# Patient Record
Sex: Male | Born: 1980 | Hispanic: Yes | Marital: Single | Smoking: Former smoker
Health system: Southern US, Community
[De-identification: ages and names within clinical notes are randomized; demographics above are authoritative.]

## PROBLEM LIST (undated history)

## (undated) DIAGNOSIS — D649 Anemia, unspecified: Secondary | ICD-10-CM

## (undated) DIAGNOSIS — M109 Gout, unspecified: Secondary | ICD-10-CM

## (undated) DIAGNOSIS — N189 Chronic kidney disease, unspecified: Secondary | ICD-10-CM

## (undated) HISTORY — DX: Chronic kidney disease, unspecified: N18.9

## (undated) HISTORY — PX: NO PAST SURGERIES: SHX2092

---

## 2015-07-11 ENCOUNTER — Encounter: Payer: Self-pay | Admitting: Vascular Surgery

## 2015-07-11 ENCOUNTER — Other Ambulatory Visit: Payer: Self-pay | Admitting: *Deleted

## 2015-07-11 DIAGNOSIS — N179 Acute kidney failure, unspecified: Secondary | ICD-10-CM

## 2015-07-11 DIAGNOSIS — Z01810 Encounter for preprocedural cardiovascular examination: Secondary | ICD-10-CM

## 2015-07-14 ENCOUNTER — Other Ambulatory Visit: Payer: Self-pay

## 2015-07-14 ENCOUNTER — Encounter (HOSPITAL_COMMUNITY): Payer: Self-pay | Admitting: *Deleted

## 2015-07-14 ENCOUNTER — Other Ambulatory Visit: Payer: Self-pay | Admitting: Nephrology

## 2015-07-14 DIAGNOSIS — N179 Acute kidney failure, unspecified: Secondary | ICD-10-CM

## 2015-07-14 DIAGNOSIS — R809 Proteinuria, unspecified: Secondary | ICD-10-CM

## 2015-07-14 MED ORDER — DEXTROSE 5 % IV SOLN
1.5000 g | INTRAVENOUS | Status: AC
  Administered 2015-07-15: 1.5 g via INTRAVENOUS
  Filled 2015-07-14: qty 1.5

## 2015-07-14 MED ORDER — SODIUM CHLORIDE 0.9 % IV SOLN
INTRAVENOUS | Status: DC
  Administered 2015-07-15: 09:00:00 via INTRAVENOUS

## 2015-07-14 MED ORDER — CHLORHEXIDINE GLUCONATE CLOTH 2 % EX PADS: 6.0000 | MEDICATED_PAD | Freq: Once | CUTANEOUS | Status: DC

## 2015-07-14 NOTE — Progress Notes (Signed)
I was able to get some info from wife but not all.Will need to be completed on arrival.Spanish interpretor set up by Dondra SpryGail

## 2015-07-15 ENCOUNTER — Ambulatory Visit (HOSPITAL_COMMUNITY): Payer: Self-pay

## 2015-07-15 ENCOUNTER — Ambulatory Visit (HOSPITAL_COMMUNITY)
Admission: RE | Admit: 2015-07-15 | Discharge: 2015-07-15 | Disposition: A | Discharge status: Home / Self Care | Payer: Self-pay | Source: Ambulatory Visit | Attending: Vascular Surgery | Admitting: Vascular Surgery

## 2015-07-15 ENCOUNTER — Ambulatory Visit (HOSPITAL_COMMUNITY): Payer: Self-pay | Admitting: Certified Registered Nurse Anesthetist

## 2015-07-15 ENCOUNTER — Other Ambulatory Visit: Payer: Self-pay | Admitting: *Deleted

## 2015-07-15 ENCOUNTER — Telehealth: Payer: Self-pay | Admitting: Vascular Surgery

## 2015-07-15 ENCOUNTER — Encounter (HOSPITAL_COMMUNITY): Payer: Self-pay | Admitting: *Deleted

## 2015-07-15 ENCOUNTER — Encounter (HOSPITAL_COMMUNITY)
Admission: RE | Disposition: A | Discharge status: Home / Self Care | Payer: Self-pay | Source: Ambulatory Visit | Attending: Vascular Surgery

## 2015-07-15 DIAGNOSIS — N186 End stage renal disease: Secondary | ICD-10-CM | POA: Insufficient documentation

## 2015-07-15 DIAGNOSIS — Z95828 Presence of other vascular implants and grafts: Secondary | ICD-10-CM

## 2015-07-15 DIAGNOSIS — Z992 Dependence on renal dialysis: Secondary | ICD-10-CM | POA: Insufficient documentation

## 2015-07-15 DIAGNOSIS — M109 Gout, unspecified: Secondary | ICD-10-CM | POA: Insufficient documentation

## 2015-07-15 DIAGNOSIS — Z419 Encounter for procedure for purposes other than remedying health state, unspecified: Secondary | ICD-10-CM

## 2015-07-15 DIAGNOSIS — Z01810 Encounter for preprocedural cardiovascular examination: Secondary | ICD-10-CM

## 2015-07-15 DIAGNOSIS — N185 Chronic kidney disease, stage 5: Secondary | ICD-10-CM

## 2015-07-15 HISTORY — DX: Anemia, unspecified: D64.9

## 2015-07-15 HISTORY — PX: INSERTION OF DIALYSIS CATHETER: SHX1324

## 2015-07-15 HISTORY — DX: Gout, unspecified: M10.9

## 2015-07-15 LAB — POCT I-STAT 4, (NA,K, GLUC, HGB,HCT)
Glucose, Bld: 97 mg/dL (ref 65–99)
HCT: 29 % — ABNORMAL LOW (ref 39.0–52.0)
Hemoglobin: 9.9 g/dL — ABNORMAL LOW (ref 13.0–17.0)
Potassium: 4.3 mmol/L (ref 3.5–5.1)
Sodium: 135 mmol/L (ref 135–145)

## 2015-07-15 SURGERY — INSERTION OF DIALYSIS CATHETER
Anesthesia: Monitor Anesthesia Care | Site: Neck | Laterality: Right

## 2015-07-15 MED ORDER — FENTANYL CITRATE (PF) 100 MCG/2ML IJ SOLN
25.0000 ug | INTRAMUSCULAR | Status: DC | PRN
  Administered 2015-07-15: 50 ug via INTRAVENOUS

## 2015-07-15 MED ORDER — SODIUM CHLORIDE 0.9 % IV SOLN
INTRAVENOUS | Status: DC | PRN
  Administered 2015-07-15: 10:00:00

## 2015-07-15 MED ORDER — LIDOCAINE HCL (PF) 1 % IJ SOLN
INTRAMUSCULAR | Status: AC
  Filled 2015-07-15: qty 30

## 2015-07-15 MED ORDER — LIDOCAINE-EPINEPHRINE (PF) 1 %-1:200000 IJ SOLN
INTRAMUSCULAR | Status: AC
  Filled 2015-07-15: qty 30

## 2015-07-15 MED ORDER — FENTANYL CITRATE (PF) 250 MCG/5ML IJ SOLN
INTRAMUSCULAR | Status: AC
  Filled 2015-07-15: qty 5

## 2015-07-15 MED ORDER — FENTANYL CITRATE (PF) 100 MCG/2ML IJ SOLN
INTRAMUSCULAR | Status: AC
  Filled 2015-07-15: qty 2

## 2015-07-15 MED ORDER — PHENYLEPHRINE 40 MCG/ML (10ML) SYRINGE FOR IV PUSH (FOR BLOOD PRESSURE SUPPORT)
PREFILLED_SYRINGE | INTRAVENOUS | Status: AC
  Filled 2015-07-15: qty 10

## 2015-07-15 MED ORDER — LIDOCAINE HCL (CARDIAC) 20 MG/ML IV SOLN
INTRAVENOUS | Status: AC
  Filled 2015-07-15: qty 5

## 2015-07-15 MED ORDER — MIDAZOLAM HCL 2 MG/2ML IJ SOLN
INTRAMUSCULAR | Status: AC
  Filled 2015-07-15: qty 2

## 2015-07-15 MED ORDER — LIDOCAINE HCL (PF) 1 % IJ SOLN
INTRAMUSCULAR | Status: DC | PRN
  Administered 2015-07-15: 14 mL

## 2015-07-15 MED ORDER — PROPOFOL 500 MG/50ML IV EMUL
INTRAVENOUS | Status: DC | PRN
  Administered 2015-07-15: 50 ug/kg/min via INTRAVENOUS

## 2015-07-15 MED ORDER — HEPARIN SODIUM (PORCINE) 1000 UNIT/ML IJ SOLN
INTRAMUSCULAR | Status: DC | PRN
  Administered 2015-07-15: 3 mL via INTRAVENOUS

## 2015-07-15 MED ORDER — ONDANSETRON HCL 4 MG/2ML IJ SOLN
INTRAMUSCULAR | Status: AC
  Filled 2015-07-15: qty 2

## 2015-07-15 MED ORDER — PROPOFOL 10 MG/ML IV BOLUS
INTRAVENOUS | Status: DC | PRN
Start: 2015-07-15 — End: 2015-07-15
  Administered 2015-07-15 (×3): 20 mg via INTRAVENOUS
  Administered 2015-07-15: 80 mg via INTRAVENOUS

## 2015-07-15 MED ORDER — HEPARIN SODIUM (PORCINE) 1000 UNIT/ML IJ SOLN
INTRAMUSCULAR | Status: AC
  Filled 2015-07-15: qty 1

## 2015-07-15 MED ORDER — ONDANSETRON HCL 4 MG/2ML IJ SOLN
INTRAMUSCULAR | Status: DC | PRN
  Administered 2015-07-15: 4 mg via INTRAVENOUS

## 2015-07-15 MED ORDER — 0.9 % SODIUM CHLORIDE (POUR BTL) OPTIME
TOPICAL | Status: DC | PRN
  Administered 2015-07-15: 1000 mL

## 2015-07-15 MED ORDER — MIDAZOLAM HCL 5 MG/5ML IJ SOLN
INTRAMUSCULAR | Status: DC | PRN
  Administered 2015-07-15: 2 mg via INTRAVENOUS

## 2015-07-15 MED ORDER — FENTANYL CITRATE (PF) 100 MCG/2ML IJ SOLN
INTRAMUSCULAR | Status: DC | PRN
  Administered 2015-07-15: 50 ug via INTRAVENOUS
  Administered 2015-07-15: 25 ug via INTRAVENOUS
  Administered 2015-07-15: 50 ug via INTRAVENOUS
  Administered 2015-07-15: 25 ug via INTRAVENOUS

## 2015-07-15 MED ORDER — PHENYLEPHRINE HCL 10 MG/ML IJ SOLN
INTRAMUSCULAR | Status: DC | PRN
  Administered 2015-07-15 (×2): 80 ug via INTRAVENOUS

## 2015-07-15 MED ORDER — PROMETHAZINE HCL 25 MG/ML IJ SOLN: 6.2500 mg | INTRAMUSCULAR | Status: DC | PRN

## 2015-07-15 SURGICAL SUPPLY — 42 items
BAG DECANTER FOR FLEXI CONT (MISCELLANEOUS) ×3 IMPLANT
BIOPATCH RED 1 DISK 7.0 (GAUZE/BANDAGES/DRESSINGS) ×2 IMPLANT
BIOPATCH RED 1IN DISK 7.0MM (GAUZE/BANDAGES/DRESSINGS) ×1
CATH CANNON HEMO 15F 50CM (CATHETERS) IMPLANT
CATH CANNON HEMO 15FR 19 (HEMODIALYSIS SUPPLIES) IMPLANT
CATH CANNON HEMO 15FR 23CM (HEMODIALYSIS SUPPLIES) IMPLANT
CATH CANNON HEMO 15FR 31CM (HEMODIALYSIS SUPPLIES) IMPLANT
CATH CANNON HEMO 15FR 32CM (HEMODIALYSIS SUPPLIES) IMPLANT
CATH PALINDROME REV 19CM (CATHETERS) ×3 IMPLANT
COVER PROBE W GEL 5X96 (DRAPES) IMPLANT
COVER SURGICAL LIGHT HANDLE (MISCELLANEOUS) ×3 IMPLANT
DECANTER SPIKE VIAL GLASS SM (MISCELLANEOUS) ×3 IMPLANT
DRAPE C-ARM 42X72 X-RAY (DRAPES) ×3 IMPLANT
DRAPE CHEST BREAST 15X10 FENES (DRAPES) ×3 IMPLANT
GAUZE SPONGE 2X2 8PLY STRL LF (GAUZE/BANDAGES/DRESSINGS) ×1 IMPLANT
GAUZE SPONGE 4X4 16PLY XRAY LF (GAUZE/BANDAGES/DRESSINGS) ×3 IMPLANT
GLOVE BIOGEL PI IND STRL 6.5 (GLOVE) ×1 IMPLANT
GLOVE BIOGEL PI INDICATOR 6.5 (GLOVE) ×2
GLOVE ECLIPSE 6.5 STRL STRAW (GLOVE) ×3 IMPLANT
GLOVE SS BIOGEL STRL SZ 7 (GLOVE) ×1 IMPLANT
GLOVE SUPERSENSE BIOGEL SZ 7 (GLOVE) ×2
GOWN STRL REUS W/ TWL LRG LVL3 (GOWN DISPOSABLE) ×2 IMPLANT
GOWN STRL REUS W/TWL LRG LVL3 (GOWN DISPOSABLE) ×4
KIT BASIN OR (CUSTOM PROCEDURE TRAY) ×3 IMPLANT
KIT ROOM TURNOVER OR (KITS) ×3 IMPLANT
LIQUID BAND (GAUZE/BANDAGES/DRESSINGS) ×3 IMPLANT
NEEDLE 18GX1X1/2 (RX/OR ONLY) (NEEDLE) ×3 IMPLANT
NEEDLE 22X1 1/2 (OR ONLY) (NEEDLE) ×3 IMPLANT
NEEDLE HYPO 25GX1X1/2 BEV (NEEDLE) ×3 IMPLANT
NS IRRIG 1000ML POUR BTL (IV SOLUTION) ×3 IMPLANT
PACK SURGICAL SETUP 50X90 (CUSTOM PROCEDURE TRAY) ×3 IMPLANT
PAD ARMBOARD 7.5X6 YLW CONV (MISCELLANEOUS) ×6 IMPLANT
SOAP 2 % CHG 4 OZ (WOUND CARE) ×3 IMPLANT
SPONGE GAUZE 2X2 STER 10/PKG (GAUZE/BANDAGES/DRESSINGS) ×2
SUT ETHILON 3 0 PS 1 (SUTURE) ×3 IMPLANT
SUT VICRYL 4-0 PS2 18IN ABS (SUTURE) ×3 IMPLANT
SYR 20CC LL (SYRINGE) ×3 IMPLANT
SYR 5ML LL (SYRINGE) ×6 IMPLANT
SYR CONTROL 10ML LL (SYRINGE) ×3 IMPLANT
SYRINGE 10CC LL (SYRINGE) ×3 IMPLANT
TAPE CLOTH SURG 4X10 WHT LF (GAUZE/BANDAGES/DRESSINGS) ×3 IMPLANT
WATER STERILE IRR 1000ML POUR (IV SOLUTION) ×3 IMPLANT

## 2015-07-15 NOTE — Telephone Encounter (Signed)
-----   Message from Sharee PimpleMarilyn K McChesney, RN sent at 07/15/2015 10:54 AM EST ----- Patient is already scheduled for this on 07-27-15 with Dr. Imogene Burnhen.  This was per Dr. Marland McalpineWebb's referral on 07-11-15. ----- Message -----    From: Dara LordsSamantha J Rhyne, PA-C    Sent: 07/15/2015  10:33 AM      To: Vvs Charge Pool  This pt had a diatek placement today.  He needs to f/u with anyone in the next 3-4 weeks for vein mapping and eval for permanent access.  Thanks, Lelon MastSamantha

## 2015-07-15 NOTE — Anesthesia Procedure Notes (Signed)
Procedure Name: MAC Date/Time: 07/15/2015 10:11 AM Performed by: Fabian NovemberSOLHEIM, Shakeema Lippman SALOMAN Pre-anesthesia Checklist: Patient identified, Timeout performed, Emergency Drugs available, Suction available and Patient being monitored Patient Re-evaluated:Patient Re-evaluated prior to inductionOxygen Delivery Method: Simple face mask Intubation Type: IV induction Number of attempts: 1

## 2015-07-15 NOTE — Progress Notes (Addendum)
Wife reports that patient has felt warm the last few days. Patient reports that his right great toe is hurting. Right great toe noted to be swollen. Dr. Hart RochesterLawson notified with no further orders. Vanice SarahAnthony Bastias spanish interpreter used during preop interview.

## 2015-07-15 NOTE — H&P (Signed)
  Vascular Surgery H&P  Chief Complaint: End-stage renal disease-needs vascular access  HPI: Kyle Schroeder is a 34 y.o. male who presents for evaluation of need for vascular access. Patient has only known about kidney failure for 2 weeks and now needs dialysis. Does not know cause of kidney failure. He is having tunneled catheter inserted today and will be evaluated for fistula creation in the near future.   Past Medical History  Diagnosis Date  . Chronic kidney disease   . Gout     takes Allopurinol daily  . Anemia    Past Surgical History  Procedure Laterality Date  . No past surgeries     Social History   Social History  . Marital Status: Single    Spouse Name: N/A  . Number of Children: N/A  . Years of Education: N/A   Social History Main Topics  . Smoking status: Never Smoker   . Smokeless tobacco: Never Used  . Alcohol Use: No  . Drug Use: No  . Sexual Activity: Not Asked   Other Topics Concern  . None   Social History Narrative   History reviewed. No pertinent family history. No Known Allergies Prior to Admission medications   Medication Sig Start Date End Date Taking? Authorizing Provider  allopurinol (ZYLOPRIM) 100 MG tablet Take 100 mg by mouth daily.   Yes Historical Provider, MD  IRON PO Take 1 tablet by mouth daily.   Yes Historical Provider, MD  sodium bicarbonate 650 MG tablet Take 650 mg by mouth 2 (two) times daily.   Yes Historical Provider, MD     Positive ROS: Denies chest pain, dyspnea on exertion, PND, orthopnea, hemoptysis, claudication. Does have gout involving his right knee and foot but otherwise completely negative review of systems  All other systems have been reviewed and were otherwise negative with the exception of those mentioned in the HPI and as above.  Physical Exam: Filed Vitals:   07/15/15 0905  BP: 141/80  Pulse: 72  Temp: 99 F (37.2 C)  Resp: 18    General: Alert, no acute distress HEENT: Normal for  age Cardiovascular: Regular rate and rhythm. Carotid pulses 2+, no bruits audible Respiratory: Clear to auscultation. No cyanosis, no use of accessory musculature GI: No organomegaly, abdomen is soft and non-tender Skin: No lesions in the area of chief complaint Neurologic: Sensation intact distally Psychiatric: Patient is competent for consent with normal mood and affect Musculoskeletal: No obvious deformities Extremities: 3+ femoral popliteal dorsalis pedis pulse palpable bilaterally. Mild discomfort or some right foot but no abscess cellulitis or fluctuance noted.  Labs reviewed: Potassium 4.6     Assessment/Plan:  Plan insertion tunneled hemodialysis catheter via right IJ or left IJ. Discussed with patient and his family via interpreter since patient does not speak [place]. He would like to proceed   Josephina GipJames Mayzee Reichenbach, MD 07/15/2015 10:00 AM

## 2015-07-15 NOTE — Op Note (Signed)
OPERATIVE REPORT  Date of Surgery: 07/15/2015  Surgeon: Kyle GipJames Antonella Upson, MD  Assistant: Nurse  Pre-op Diagnosis: End Stage Renal Disease N18.6  Post-op Diagnosis: End Stage Renal Disease.  Procedure: Procedure(s): INSERTION OF Right Internal Jugular DIALYSIS CATHETER.-19 cm-palindrome Bilateral ultrasound localization and internal jugular veins  Anesthesia: Mac  EBL: Minimal  Complications: None  Procedure Details: The patient was taken the operating room placed in Trendelenburg position upper chest and neck were exposed. Both internal jugular veins were imaged using B mode ultrasound-sono site and both noted to be widely patent. After prepping and draped in routine sterile manner the right IJ was entered using a supraclavicular approach guidewire passed into the right atrium under fluoroscopic guidance. A 19 cm palindrome catheter was passed to a peel-away sheath positioned appropriately tunneled peripherally and secured with nylon suture. The wound was closed Vicryl subcuticular fashion sterile dressing applied and patient taken to recovery in satisfactory condition for a chest x-ray   Kyle GipJames Aneisha Skyles, MD 07/15/2015 10:38 AM

## 2015-07-15 NOTE — Progress Notes (Signed)
Discharge instructions given to wife[speaks English] with Cherie DarkAnthony Bastias-interpreter. Advised to call MD's office for pain medicine not controlled by Tylenol, same with dressing changes.

## 2015-07-15 NOTE — Anesthesia Postprocedure Evaluation (Signed)
Anesthesia Post Note  Patient: Kyle Schroeder  Procedure(s) Performed: Procedure(s) (LRB): INSERTION OF Right Internal Jugular DIALYSIS CATHETER. (Right)  Patient location during evaluation: PACU Anesthesia Type: MAC Level of consciousness: awake and alert Pain management: pain level controlled Vital Signs Assessment: post-procedure vital signs reviewed and stable Respiratory status: spontaneous breathing, nonlabored ventilation, respiratory function stable and patient connected to nasal cannula oxygen Cardiovascular status: blood pressure returned to baseline and stable Postop Assessment: no signs of nausea or vomiting Anesthetic complications: no    Last Vitals:  Filed Vitals:   07/15/15 1215 07/15/15 1245  BP:  138/87  Pulse: 87 96  Temp: 36.6 C   Resp:      Last Pain:  Filed Vitals:   07/15/15 1252  PainSc: 5                  Jeri Jeanbaptiste S

## 2015-07-15 NOTE — Anesthesia Preprocedure Evaluation (Signed)
Anesthesia Evaluation  Patient identified by MRN, date of birth, ID band Patient awake    Reviewed: Allergy & Precautions, NPO status , Patient's Chart, lab work & pertinent test results  Airway Mallampati: II  TM Distance: >3 FB Neck ROM: Full    Dental no notable dental hx.    Pulmonary neg pulmonary ROS,    Pulmonary exam normal breath sounds clear to auscultation       Cardiovascular negative cardio ROS Normal cardiovascular exam Rhythm:Regular Rate:Normal     Neuro/Psych negative neurological ROS  negative psych ROS   GI/Hepatic negative GI ROS, Neg liver ROS,   Endo/Other  negative endocrine ROS  Renal/GU ESRFRenal disease  negative genitourinary   Musculoskeletal negative musculoskeletal ROS (+)   Abdominal   Peds negative pediatric ROS (+)  Hematology negative hematology ROS (+)   Anesthesia Other Findings   Reproductive/Obstetrics negative OB ROS                             Anesthesia Physical Anesthesia Plan  ASA: III  Anesthesia Plan: MAC   Post-op Pain Management:    Induction: Intravenous  Airway Management Planned: Simple Face Mask  Additional Equipment:   Intra-op Plan:   Post-operative Plan:   Informed Consent: I have reviewed the patients History and Physical, chart, labs and discussed the procedure including the risks, benefits and alternatives for the proposed anesthesia with the patient or authorized representative who has indicated his/her understanding and acceptance.   Dental advisory given  Plan Discussed with: CRNA and Surgeon  Anesthesia Plan Comments:         Anesthesia Quick Evaluation

## 2015-07-15 NOTE — Transfer of Care (Signed)
Immediate Anesthesia Transfer of Care Note  Patient: Kyle Schroeder  Procedure(s) Performed: Procedure(s): INSERTION OF Right Internal Jugular DIALYSIS CATHETER. (Right)  Patient Location: PACU  Anesthesia Type:MAC  Level of Consciousness: awake, alert , oriented and patient cooperative  Airway & Oxygen Therapy: Patient Spontanous Breathing and Patient connected to nasal cannula oxygen  Post-op Assessment: Report given to RN, Post -op Vital signs reviewed and stable and Patient moving all extremities X 4  Post vital signs: Reviewed and stable  Last Vitals:  Filed Vitals:   07/15/15 0905  BP: 141/80  Pulse: 72  Temp: 37.2 C  Resp: 18    Complications: No apparent anesthesia complications

## 2015-07-15 NOTE — Discharge Instructions (Signed)
° ° °  07/15/2015 Rosiland OzVictor Slay [id number] 09/11/1980  Surgeon(s): Pryor OchoaJames D Lawson, MD  Procedure(s): INSERTION OF DIALYSIS CATHETER

## 2015-07-18 ENCOUNTER — Encounter (HOSPITAL_COMMUNITY): Payer: Self-pay | Admitting: Vascular Surgery

## 2015-07-21 ENCOUNTER — Encounter: Payer: Self-pay | Admitting: Vascular Surgery

## 2015-07-27 ENCOUNTER — Ambulatory Visit (INDEPENDENT_AMBULATORY_CARE_PROVIDER_SITE_OTHER): Payer: Self-pay | Admitting: Vascular Surgery

## 2015-07-27 ENCOUNTER — Ambulatory Visit (HOSPITAL_COMMUNITY)
Admission: RE | Admit: 2015-07-27 | Discharge: 2015-07-27 | Disposition: A | Discharge status: Home / Self Care | Payer: Self-pay | Source: Ambulatory Visit | Attending: Vascular Surgery | Admitting: Vascular Surgery

## 2015-07-27 ENCOUNTER — Encounter: Payer: Self-pay | Admitting: Vascular Surgery

## 2015-07-27 ENCOUNTER — Ambulatory Visit (INDEPENDENT_AMBULATORY_CARE_PROVIDER_SITE_OTHER)
Admission: RE | Admit: 2015-07-27 | Discharge: 2015-07-27 | Disposition: A | Discharge status: Home / Self Care | Payer: Self-pay | Source: Ambulatory Visit | Attending: Vascular Surgery | Admitting: Vascular Surgery

## 2015-07-27 VITALS — BP 120/76 | HR 98 | Temp 97.7°F | Resp 16 | Ht 67.5 in | Wt 140.0 lb

## 2015-07-27 DIAGNOSIS — Z992 Dependence on renal dialysis: Secondary | ICD-10-CM

## 2015-07-27 DIAGNOSIS — N189 Chronic kidney disease, unspecified: Secondary | ICD-10-CM | POA: Insufficient documentation

## 2015-07-27 DIAGNOSIS — N186 End stage renal disease: Secondary | ICD-10-CM | POA: Insufficient documentation

## 2015-07-27 DIAGNOSIS — N179 Acute kidney failure, unspecified: Secondary | ICD-10-CM | POA: Insufficient documentation

## 2015-07-27 DIAGNOSIS — Z01810 Encounter for preprocedural cardiovascular examination: Secondary | ICD-10-CM | POA: Insufficient documentation

## 2015-07-27 NOTE — Progress Notes (Signed)
Referred by:  Elvis Coil, MD [address] [place], [place] [address]  Reason for referral: New access  History of Present Illness  Kyle Schroeder is a 34 y.o. ([birth year]) male who presents for evaluation for permanent access.  The patient is right hand dominant.  The patient has not had previous permanent access procedures.  Previous central venous cannulation procedures include: RIJV TDC.  The patient has never had a PPM placed.   Past Medical History  Diagnosis Date  . Chronic kidney disease   . Gout     takes Allopurinol daily  . Anemia     Past Surgical History  Procedure Laterality Date  . No past surgeries    . Insertion of dialysis catheter Right 07/15/2015    Procedure: INSERTION OF Right Internal Jugular DIALYSIS CATHETER.;  Surgeon: Pryor Ochoa, MD;  Location: [facility] OR;  Service: Vascular;  Laterality: Right;    Social History   Social History  . Marital Status: Single    Spouse Name: N/A  . Number of Children: N/A  . Years of Education: N/A   Occupational History  . Not on file.   Social History Main Topics  . Smoking status: Never Smoker   . Smokeless tobacco: Never Used  . Alcohol Use: No  . Drug Use: No  . Sexual Activity: Not on file   Other Topics Concern  . Not on file   Social History Narrative    Family History: patient is unable to detail the medical history of his parents  Current Outpatient Prescriptions  Medication Sig Dispense Refill  . allopurinol (ZYLOPRIM) 100 MG tablet Take 100 mg by mouth daily.    . B Complex-C-Folic Acid (NEPHRO-VITE PO) Take by mouth.    . IRON PO Take 1 tablet by mouth daily.    . predniSONE (DELTASONE) 10 MG tablet Take 10 mg by mouth daily with breakfast.    . sodium bicarbonate 650 MG tablet Take 650 mg by mouth 2 (two) times daily.     No current facility-administered medications for this visit.    No Known Allergies  REVIEW OF SYSTEMS:  (Positives checked otherwise negative)  CARDIOVASCULAR:     chest pain,   chest pressure,   palpitations,   shortness of breath when laying flat,   shortness of breath with exertion,    pain in feet when walking,   pain in feet when laying flat,  history of blood clot in veins (DVT),   history of phlebitis,   swelling in legs,   varicose veins  PULMONARY:    productive cough,   asthma,   wheezing  NEUROLOGIC:    weakness in arms or legs,   numbness in arms or legs,   difficulty speaking or slurred speech,   temporary loss of vision in one eye,   dizziness  HEMATOLOGIC:    bleeding problems,   problems with blood clotting too easily  MUSCULOSKEL:    joint pain,  joint swelling  GASTROINTEST:    vomiting blood,   blood in stool     GENITOURINARY:    burning with urination,   blood in urine  ESRD-HD: T/R/S  PSYCHIATRIC:    history of major depression  INTEGUMENTARY:    rashes,   ulcers  CONSTITUTIONAL:    fever,    chills   Physical Examination  Filed Vitals:   07/27/15 1630  BP: 120/76  Pulse: 98  Temp: 97.7 F (36.5 C)  Resp: 16  Height: 5' 7.5" (1.715 m)  Weight: 140 lb (63.504 kg)  SpO2: 100%   Body mass index is 21.59 kg/(m^2).  General: A&O x 3, WDWN  Head: [place]/AT  Ear/Nose/Throat: Hearing grossly intact, nares w/o erythema or drainage, oropharynx w/o Erythema/Exudate, Mallampati score: 3  Eyes: PERRLA, EOMI  Neck: Supple, no nuchal rigidity, no palpable LAD  Pulmonary: Sym exp, good air movt, CTAB, no rales, rhonchi, & wheezing  Cardiac: RRR, Nl S1, S2, no Murmurs, rubs or gallops  Vascular: Vessel Right Left  Radial Palpable Palpable  Ulnar Palpable Palpable  Brachial Palpable Palpable  Carotid Palpable, without bruit Palpable, without bruit  Aorta Not palpable N/A  Femoral Palpable Palpable  Popliteal Not palpable Not palpable  PT Palpable Palpable  DP Palpable Palpable    Gastrointestinal: soft, NTND, -G/R, - HSM, - masses, - CVAT B  Musculoskeletal: M/S 5/5 throughout , Extremities without ischemic changes   Neurologic: CN 2-12 intact , Pain and light touch intact in extremities , Motor exam as listed above  Psychiatric: Judgment intact, Mood & affect appropriate for pt's clinical situation  Dermatologic: See M/S exam for extremity exam, no rashes otherwise noted  Lymph : No Cervical, Axillary, or Inguinal lymphadenopathy   Non-Invasive Vascular Imaging  Vein Mapping  (Date: 07/27/2015):   R arm: acceptable vein conduits include likely entire cephalic vein, marginal upper arm basilic vein  L arm: acceptable vein conduits include possible upper arm cephalic vein  BUE Doppler (Date: 07/27/2015):   R arm:   Brachial: tri, 5.0 mm  Radial: tri, 2.2 mm  Ulnar: tri, 1.9 mm  L arm:   Brachial: tri, 4.8 mm  Radial: tri, 2.2 mm  Ulnar: tri, 2.0 mm   Outside Studies/Documentation 4 pages of outside documents were reviewed including: outpatient Nephrology chart.   Medical Decision Making  Rosiland OzVictor Plata is a 34 y.o. male who presents with ESRD requiring hemodialysis.   Based on vein mapping and examination, this patient's permanent access options include: L BC AVF, R BC AVF, R staged BVT.  I would start with L BC AVF  I had an extensive discussion with this patient in regards to the nature of access surgery, including risk, benefits, and alternatives.    The patient is aware that the risks of access surgery include but are not limited to: bleeding, infection, steal syndrome, nerve damage, ischemic monomelic neuropathy, failure of access to mature, and possible need for additional access procedures in the future.  The patient has agreed to proceed with the above procedure which will be scheduled 4 JAN 17.   Leonides SakeBrian Chen, MD [facility] Office: [phone] Pager: [phone]  07/27/2015, 5:00  PM

## 2015-07-28 ENCOUNTER — Other Ambulatory Visit: Payer: Self-pay

## 2015-08-16 ENCOUNTER — Encounter (HOSPITAL_COMMUNITY): Payer: Self-pay | Admitting: *Deleted

## 2015-08-16 MED ORDER — CHLORHEXIDINE GLUCONATE CLOTH 2 % EX PADS: 6.0000 | MEDICATED_PAD | Freq: Once | CUTANEOUS | Status: DC

## 2015-08-16 MED ORDER — DEXTROSE 5 % IV SOLN
1.5000 g | INTRAVENOUS | Status: AC
  Administered 2015-08-17: 1.5 g via INTRAVENOUS
  Filled 2015-08-16: qty 1.5

## 2015-08-16 MED ORDER — SODIUM CHLORIDE 0.9 % IV SOLN
INTRAVENOUS | Status: DC
  Administered 2015-08-17: 15:00:00 via INTRAVENOUS

## 2015-08-16 NOTE — Progress Notes (Signed)
SDW-pre op call completed using Spanish interpreter Raphael # [phone].

## 2015-08-17 ENCOUNTER — Encounter (HOSPITAL_COMMUNITY)
Admission: RE | Disposition: A | Discharge status: Home / Self Care | Payer: Self-pay | Source: Ambulatory Visit | Attending: Vascular Surgery

## 2015-08-17 ENCOUNTER — Encounter (HOSPITAL_COMMUNITY): Payer: Self-pay | Admitting: *Deleted

## 2015-08-17 ENCOUNTER — Ambulatory Visit (HOSPITAL_COMMUNITY): Payer: Self-pay | Admitting: Anesthesiology

## 2015-08-17 ENCOUNTER — Ambulatory Visit (HOSPITAL_COMMUNITY)
Admission: RE | Admit: 2015-08-17 | Discharge: 2015-08-17 | Disposition: A | Discharge status: Home / Self Care | Payer: Self-pay | Source: Ambulatory Visit | Attending: Vascular Surgery | Admitting: Vascular Surgery

## 2015-08-17 ENCOUNTER — Other Ambulatory Visit: Payer: Self-pay | Admitting: *Deleted

## 2015-08-17 DIAGNOSIS — N186 End stage renal disease: Secondary | ICD-10-CM | POA: Insufficient documentation

## 2015-08-17 DIAGNOSIS — Z87891 Personal history of nicotine dependence: Secondary | ICD-10-CM | POA: Insufficient documentation

## 2015-08-17 DIAGNOSIS — N185 Chronic kidney disease, stage 5: Secondary | ICD-10-CM | POA: Diagnosis not present

## 2015-08-17 DIAGNOSIS — Z48812 Encounter for surgical aftercare following surgery on the circulatory system: Secondary | ICD-10-CM

## 2015-08-17 DIAGNOSIS — Z992 Dependence on renal dialysis: Secondary | ICD-10-CM | POA: Insufficient documentation

## 2015-08-17 DIAGNOSIS — M109 Gout, unspecified: Secondary | ICD-10-CM | POA: Insufficient documentation

## 2015-08-17 HISTORY — PX: AV FISTULA PLACEMENT: SHX1204

## 2015-08-17 LAB — POCT I-STAT 4, (NA,K, GLUC, HGB,HCT)
Glucose, Bld: 84 mg/dL (ref 65–99)
HEMATOCRIT: 35 % — AB (ref 39.0–52.0)
HEMOGLOBIN: 11.9 g/dL — AB (ref 13.0–17.0)
POTASSIUM: 4.6 mmol/L (ref 3.5–5.1)
SODIUM: 141 mmol/L (ref 135–145)

## 2015-08-17 SURGERY — ARTERIOVENOUS (AV) FISTULA CREATION
Anesthesia: Monitor Anesthesia Care | Site: Arm Lower | Laterality: Left

## 2015-08-17 MED ORDER — MIDAZOLAM HCL 2 MG/2ML IJ SOLN
INTRAMUSCULAR | Status: AC
  Filled 2015-08-17: qty 2

## 2015-08-17 MED ORDER — MIDAZOLAM HCL 5 MG/5ML IJ SOLN
INTRAMUSCULAR | Status: DC | PRN
  Administered 2015-08-17: 2 mg via INTRAVENOUS

## 2015-08-17 MED ORDER — LIDOCAINE-EPINEPHRINE 0.5 %-1:200000 IJ SOLN
INTRAMUSCULAR | Status: AC
  Filled 2015-08-17: qty 1

## 2015-08-17 MED ORDER — FENTANYL CITRATE (PF) 250 MCG/5ML IJ SOLN
INTRAMUSCULAR | Status: AC
  Filled 2015-08-17: qty 5

## 2015-08-17 MED ORDER — ONDANSETRON HCL 4 MG/2ML IJ SOLN: 4.0000 mg | Freq: Once | INTRAMUSCULAR | Status: DC | PRN

## 2015-08-17 MED ORDER — FENTANYL CITRATE (PF) 100 MCG/2ML IJ SOLN: 25.0000 ug | INTRAMUSCULAR | Status: DC | PRN

## 2015-08-17 MED ORDER — OXYCODONE HCL 5 MG PO TABS
ORAL_TABLET | ORAL | Status: AC
  Filled 2015-08-17: qty 1

## 2015-08-17 MED ORDER — ONDANSETRON HCL 4 MG/2ML IJ SOLN
INTRAMUSCULAR | Status: DC | PRN
  Administered 2015-08-17: 4 mg via INTRAVENOUS

## 2015-08-17 MED ORDER — LIDOCAINE HCL (CARDIAC) 20 MG/ML IV SOLN
INTRAVENOUS | Status: DC | PRN
  Administered 2015-08-17: 50 mg via INTRATRACHEAL

## 2015-08-17 MED ORDER — PHENYLEPHRINE HCL 10 MG/ML IJ SOLN
INTRAMUSCULAR | Status: DC | PRN
  Administered 2015-08-17 (×3): 80 ug via INTRAVENOUS

## 2015-08-17 MED ORDER — OXYCODONE HCL 5 MG PO TABS
5.0000 mg | ORAL_TABLET | Freq: Once | ORAL | Status: AC | PRN
  Administered 2015-08-17: 5 mg via ORAL

## 2015-08-17 MED ORDER — OXYCODONE HCL 5 MG/5ML PO SOLN: 5.0000 mg | Freq: Once | ORAL | Status: AC | PRN

## 2015-08-17 MED ORDER — SODIUM CHLORIDE 0.9 % IV SOLN
INTRAVENOUS | Status: DC | PRN
  Administered 2015-08-17: 15:00:00

## 2015-08-17 MED ORDER — VECURONIUM BROMIDE 10 MG IV SOLR
INTRAVENOUS | Status: AC
  Filled 2015-08-17: qty 10

## 2015-08-17 MED ORDER — 0.9 % SODIUM CHLORIDE (POUR BTL) OPTIME
TOPICAL | Status: DC | PRN
  Administered 2015-08-17: 1000 mL

## 2015-08-17 MED ORDER — PROPOFOL 500 MG/50ML IV EMUL
INTRAVENOUS | Status: DC | PRN
  Administered 2015-08-17: 50 ug/kg/min via INTRAVENOUS
  Administered 2015-08-17: 100 ug/kg/min via INTRAVENOUS

## 2015-08-17 MED ORDER — OXYCODONE-ACETAMINOPHEN 5-325 MG PO TABS: 1.0000 | ORAL_TABLET | Freq: Four times a day (QID) | ORAL | Status: AC | PRN

## 2015-08-17 MED ORDER — PHENYLEPHRINE 40 MCG/ML (10ML) SYRINGE FOR IV PUSH (FOR BLOOD PRESSURE SUPPORT)
PREFILLED_SYRINGE | INTRAVENOUS | Status: AC
  Filled 2015-08-17: qty 30

## 2015-08-17 MED ORDER — FENTANYL CITRATE (PF) 100 MCG/2ML IJ SOLN
INTRAMUSCULAR | Status: DC | PRN
  Administered 2015-08-17 (×3): 50 ug via INTRAVENOUS

## 2015-08-17 MED ORDER — BUPIVACAINE-EPINEPHRINE 0.5% -1:200000 IJ SOLN
INTRAMUSCULAR | Status: DC | PRN
  Administered 2015-08-17: 50 mL

## 2015-08-17 MED ORDER — LIDOCAINE HCL (CARDIAC) 20 MG/ML IV SOLN
INTRAVENOUS | Status: AC
  Filled 2015-08-17: qty 5

## 2015-08-17 SURGICAL SUPPLY — 41 items
ARMBAND PINK RESTRICT EXTREMIT (MISCELLANEOUS) ×3 IMPLANT
BENZOIN TINCTURE PRP APPL 2/3 (GAUZE/BANDAGES/DRESSINGS) ×3 IMPLANT
CANISTER SUCTION 2500CC (MISCELLANEOUS) ×3 IMPLANT
CANNULA VESSEL 3MM 2 BLNT TIP (CANNULA) ×6 IMPLANT
CLIP LIGATING EXTRA MED SLVR (CLIP) ×3 IMPLANT
CLIP LIGATING EXTRA SM BLUE (MISCELLANEOUS) ×3 IMPLANT
CLIP TI MEDIUM 6 (CLIP) ×3 IMPLANT
CLIP TI WIDE RED SMALL 6 (CLIP) ×3 IMPLANT
CLOSURE WOUND 1/2 X4 (GAUZE/BANDAGES/DRESSINGS) ×1
COVER PROBE W GEL 5X96 (DRAPES) ×3 IMPLANT
DECANTER SPIKE VIAL GLASS SM (MISCELLANEOUS) ×3 IMPLANT
ELECT REM PT RETURN 9FT ADLT (ELECTROSURGICAL) ×3
ELECTRODE REM PT RTRN 9FT ADLT (ELECTROSURGICAL) ×1 IMPLANT
GLOVE BIO SURGEON STRL SZ 6.5 (GLOVE) ×2 IMPLANT
GLOVE BIO SURGEON STRL SZ7 (GLOVE) IMPLANT
GLOVE BIO SURGEONS STRL SZ 6.5 (GLOVE) ×1
GLOVE BIOGEL PI IND STRL 6.5 (GLOVE) ×1 IMPLANT
GLOVE BIOGEL PI IND STRL 7.5 (GLOVE) IMPLANT
GLOVE BIOGEL PI INDICATOR 6.5 (GLOVE) ×2
GLOVE BIOGEL PI INDICATOR 7.5 (GLOVE)
GOWN STRL REUS W/ TWL LRG LVL3 (GOWN DISPOSABLE) ×3 IMPLANT
GOWN STRL REUS W/TWL LRG LVL3 (GOWN DISPOSABLE) ×6
HEMOSTAT SPONGE AVITENE ULTRA (HEMOSTASIS) IMPLANT
KIT BASIN OR (CUSTOM PROCEDURE TRAY) ×3 IMPLANT
KIT ROOM TURNOVER OR (KITS) ×3 IMPLANT
LIQUID BAND (GAUZE/BANDAGES/DRESSINGS) ×3 IMPLANT
NS IRRIG 1000ML POUR BTL (IV SOLUTION) ×3 IMPLANT
PACK CV ACCESS (CUSTOM PROCEDURE TRAY) ×3 IMPLANT
PAD ARMBOARD 7.5X6 YLW CONV (MISCELLANEOUS) ×6 IMPLANT
SPONGE GAUZE 4X4 12PLY STER LF (GAUZE/BANDAGES/DRESSINGS) ×3 IMPLANT
STRIP CLOSURE SKIN 1/2X4 (GAUZE/BANDAGES/DRESSINGS) ×2 IMPLANT
SUT MNCRL AB 4-0 PS2 18 (SUTURE) IMPLANT
SUT PROLENE 6 0 BV (SUTURE) IMPLANT
SUT PROLENE 6 0 CC (SUTURE) ×3 IMPLANT
SUT PROLENE 7 0 BV 1 (SUTURE) ×3 IMPLANT
SUT VIC AB 3-0 SH 27 (SUTURE) ×2
SUT VIC AB 3-0 SH 27X BRD (SUTURE) ×1 IMPLANT
SUT VICRYL 4-0 PS2 18IN ABS (SUTURE) ×3 IMPLANT
TAPE CLOTH SURG 4X10 WHT LF (GAUZE/BANDAGES/DRESSINGS) ×3 IMPLANT
UNDERPAD 30X30 INCONTINENT (UNDERPADS AND DIAPERS) ×3 IMPLANT
WATER STERILE IRR 1000ML POUR (IV SOLUTION) ×3 IMPLANT

## 2015-08-17 NOTE — Op Note (Signed)
    OPERATIVE REPORT  DATE OF SURGERY: 08/17/2015  PATIENT: Kyle Schroeder, 35 y.o. male MRN: [id number]  DOB: 11/25/1980  PRE-OPERATIVE DIAGNOSIS: End-stage renal disease  POST-OPERATIVE DIAGNOSIS:  Same  PROCEDURE: Left radiocephalic AV fistula  SURGEON:  Gretta Beganodd Kealan Buchan, M.D.  PHYSICIAN ASSISTANT: Nurse  ANESTHESIA:  Local with sedation  EBL: Minimal ml  Total I/O In: 350 [I.V.:350] Out: 10 [Blood:10]  BLOOD ADMINISTERED: None  DRAINS: None  SPECIMEN: None  COUNTS CORRECT:  YES  PLAN OF CARE: PACU   PATIENT DISPOSITION:  PACU - hemodynamically stable  PROCEDURE DETAILS: She was taken to the operative placed supine dysrhythmia the area of the left wrist left arm prepped draped in sterile fashion. SonoSite ultrasound was used to visualize the cephalic vein at the wrist which was of the moderate size. This is been confirmed with preoperative formal vein mapping. Using local anesthesia incision was made to the level of the cephalic vein and the radial artery at the wrist. The vein was mobilized and curvature branches were ligated with 301 4-0 silk ties and divided. The vein was ligated distally and divided. The vein was gently dilated was found to be of adequate size for fistula creation. The radial artery was exposed the same incision. A small. The artery was occluded proximally and distally was opened with 11 blade some nausea only with Potts scissors. The vein was cut to appropriate length and was spatulated and sewn end-to-side to the artery with a running 6-0 chromic suture. Clamps removed and good thrill was noted. Wounds irrigated with saline. Hemostasis tablet cautery. Wounds were closed with 30 Vicryls in the subcutaneous and subcuticular tissue. Benzoin Steri-Strips were applied   Gretta Beganodd Brodi Nery, M.D. 08/17/2015 5:35 PM

## 2015-08-17 NOTE — Transfer of Care (Signed)
Immediate Anesthesia Transfer of Care Note  Patient: Kyle Schroeder  Procedure(s) Performed: Procedure(s): Creation of Left Arm Radiocephalic ARTERIOVENOUS  FISTULA  (Left)  Patient Location: PACU  Anesthesia Type:MAC  Level of Consciousness: awake, alert , oriented and sedated  Airway & Oxygen Therapy: Patient Spontanous Breathing and Patient connected to nasal cannula oxygen  Post-op Assessment: Report given to RN, Post -op Vital signs reviewed and stable and Patient moving all extremities  Post vital signs: Reviewed and stable  Last Vitals:  Filed Vitals:   08/17/15 0915  BP: 113/62  Pulse: 75  Temp: 36.8 C  Resp: 16    Complications: No apparent anesthesia complications

## 2015-08-17 NOTE — Anesthesia Preprocedure Evaluation (Addendum)
Anesthesia Evaluation  Patient identified by MRN, date of birth, ID band Patient awake    Reviewed: Allergy & Precautions, H&P , NPO status , Patient's Chart, lab work & pertinent test results  History of Anesthesia Complications Negative for: history of anesthetic complications  Airway Mallampati: II  TM Distance: >3 FB Neck ROM: full    Dental no notable dental hx.    Pulmonary former smoker,    Pulmonary exam normal breath sounds clear to auscultation       Cardiovascular negative cardio ROS Normal cardiovascular exam Rhythm:regular Rate:Normal     Neuro/Psych negative neurological ROS     GI/Hepatic negative GI ROS, Neg liver ROS,   Endo/Other  negative endocrine ROS  Renal/GU ESRFRenal disease     Musculoskeletal   Abdominal   Peds  Hematology negative hematology ROS (+)   Anesthesia Other Findings   Reproductive/Obstetrics negative OB ROS                            Anesthesia Physical Anesthesia Plan  ASA: III  Anesthesia Plan: MAC   Post-op Pain Management:    Induction: Intravenous  Airway Management Planned: Simple Face Mask  Additional Equipment:   Intra-op Plan:   Post-operative Plan:   Informed Consent: I have reviewed the patients History and Physical, chart, labs and discussed the procedure including the risks, benefits and alternatives for the proposed anesthesia with the patient or authorized representative who has indicated his/her understanding and acceptance.   Dental Advisory Given  Plan Discussed with: Anesthesiologist, CRNA and Surgeon  Anesthesia Plan Comments: (Consent via spanish interpreter)       Anesthesia Quick Evaluation

## 2015-08-17 NOTE — Interval H&P Note (Signed)
History and Physical Interval Note:  08/17/2015 2:26 PM  Kyle Schroeder  has presented today for surgery, with the diagnosis of End Stage Renal Disease N18.6  The various methods of treatment have been discussed with the patient and family. After consideration of risks, benefits and other options for treatment, the patient has consented to  Procedure(s): BRACHIOCEPHALIC ARTERIOVENOUS (AV) FISTULA CREATION (Left) as a surgical intervention .  The patient's history has been reviewed, patient examined, no change in status, stable for surgery.  I have reviewed the patient's chart and labs.  Questions were answered to the patient's satisfaction.     Gretta BeganEarly, Emanuele Mcwhirter

## 2015-08-17 NOTE — Anesthesia Postprocedure Evaluation (Signed)
Anesthesia Post Note  Patient: Kyle Schroeder  Procedure(s) Performed: Procedure(s) (LRB): Creation of Left Arm Radiocephalic ARTERIOVENOUS  FISTULA  (Left)  Patient location during evaluation: PACU Anesthesia Type: MAC Level of consciousness: awake and alert Pain management: pain level controlled Vital Signs Assessment: post-procedure vital signs reviewed and stable Respiratory status: spontaneous breathing, nonlabored ventilation, respiratory function stable and patient connected to nasal cannula oxygen Cardiovascular status: stable and blood pressure returned to baseline Anesthetic complications: no    Last Vitals:  Filed Vitals:   08/17/15 1718 08/17/15 1744  BP: 128/73   Pulse: 68   Temp:  36.4 C  Resp:      Last Pain:  Filed Vitals:   08/17/15 1749  PainSc: 3                  Shelton SilvasKevin D Dollie Mayse

## 2015-08-17 NOTE — OR Nursing (Signed)
Discharge instructions discussed with patient and family via interpreter, Elna Breslowarol Hernandez.

## 2015-08-17 NOTE — H&P (View-Only) (Signed)
Referred by:  Elvis CoilMartin Webb, MD [address] [place], [place] [id number]  Reason for referral: New access  History of Present Illness  Kyle Schroeder is a 35 y.o. (06/25/1981) male who presents for evaluation for permanent access.  The patient is right hand dominant.  The patient has not had previous permanent access procedures.  Previous central venous cannulation procedures include: RIJV TDC.  The patient has never had a PPM placed.   Past Medical History  Diagnosis Date  . Chronic kidney disease   . Gout     takes Allopurinol daily  . Anemia     Past Surgical History  Procedure Laterality Date  . No past surgeries    . Insertion of dialysis catheter Right 07/15/2015    Procedure: INSERTION OF Right Internal Jugular DIALYSIS CATHETER.;  Surgeon: Pryor OchoaJames D Lawson, MD;  [facility];  Service: Vascular;  Laterality: Right;    Social History   Social History  . Marital Status: Single    Spouse Name: N/A  . Number of Children: N/A  . Years of Education: N/A   Occupational History  . Not on file.   Social History Main Topics  . Smoking status: Never Smoker   . Smokeless tobacco: Never Used  . Alcohol Use: No  . Drug Use: No  . Sexual Activity: Not on file   Other Topics Concern  . Not on file   Social History Narrative    Family History: patient is unable to detail the medical history of his parents  Current Outpatient Prescriptions  Medication Sig Dispense Refill  . allopurinol (ZYLOPRIM) 100 MG tablet Take 100 mg by mouth daily.    . B Complex-C-Folic Acid (NEPHRO-VITE PO) Take by mouth.    . IRON PO Take 1 tablet by mouth daily.    . predniSONE (DELTASONE) 10 MG tablet Take 10 mg by mouth daily with breakfast.    . sodium bicarbonate 650 MG tablet Take 650 mg by mouth 2 (two) times daily.     No current facility-administered medications for this visit.    No Known Allergies  REVIEW OF SYSTEMS:  (Positives checked otherwise negative)  CARDIOVASCULAR:   [ ]   chest pain,  [ ]  chest pressure,  [ ]  palpitations,  [ ]  shortness of breath when laying flat,  [ ]  shortness of breath with exertion,   [ ]  pain in feet when walking,  [ ]  pain in feet when laying flat, [ ]  history of blood clot in veins (DVT),  [ ]  history of phlebitis,  [ ]  swelling in legs,  [ ]  varicose veins  PULMONARY:   [ ]  productive cough,  [ ]  asthma,  [ ]  wheezing  NEUROLOGIC:   [ ]  weakness in arms or legs,  [ ]  numbness in arms or legs,  [ ]  difficulty speaking or slurred speech,  [ ]  temporary loss of vision in one eye,  [ ]  dizziness  HEMATOLOGIC:   [ ]  bleeding problems,  [ ]  problems with blood clotting too easily  MUSCULOSKEL:   [ ]  joint pain, [ ]  joint swelling  GASTROINTEST:   [ ]  vomiting blood,  [ ]  blood in stool     GENITOURINARY:   [ ]  burning with urination,  [ ]  blood in urine [x]  ESRD-HD: T/R/S  PSYCHIATRIC:   [ ]  history of major depression  INTEGUMENTARY:   [ ]  rashes,  [ ]  ulcers  CONSTITUTIONAL:   [ ]  fever,  [ ]   chills   Physical Examination  Filed Vitals:   07/27/15 1630  BP: 120/76  Pulse: 98  Temp: 97.7 F (36.5 C)  Resp: 16  Height: 5' 7.5" (1.715 m)  Weight: 140 lb (63.504 kg)  SpO2: 100%   Body mass index is 21.59 kg/(m^2).  General: A&O x 3, WDWN  Head: [place]/AT  Ear/Nose/Throat: Hearing grossly intact, nares w/o erythema or drainage, oropharynx w/o Erythema/Exudate, Mallampati score: 3  Eyes: PERRLA, EOMI  Neck: Supple, no nuchal rigidity, no palpable LAD  Pulmonary: Sym exp, good air movt, CTAB, no rales, rhonchi, & wheezing  Cardiac: RRR, Nl S1, S2, no Murmurs, rubs or gallops  Vascular: Vessel Right Left  Radial Palpable Palpable  Ulnar Palpable Palpable  Brachial Palpable Palpable  Carotid Palpable, without bruit Palpable, without bruit  Aorta Not palpable N/A  Femoral Palpable Palpable  Popliteal Not palpable Not palpable  PT Palpable Palpable  DP Palpable Palpable    Gastrointestinal: soft, NTND, -G/R, - HSM, - masses, - CVAT B  Musculoskeletal: M/S 5/5 throughout , Extremities without ischemic changes   Neurologic: CN 2-12 intact , Pain and light touch intact in extremities , Motor exam as listed above  Psychiatric: Judgment intact, Mood & affect appropriate for pt's clinical situation  Dermatologic: See M/S exam for extremity exam, no rashes otherwise noted  Lymph : No Cervical, Axillary, or Inguinal lymphadenopathy   Non-Invasive Vascular Imaging  Vein Mapping  (Date: 07/27/2015):   R arm: acceptable vein conduits include likely entire cephalic vein, marginal upper arm basilic vein  L arm: acceptable vein conduits include possible upper arm cephalic vein  BUE Doppler (Date: 07/27/2015):   R arm:   Brachial: tri, 5.0 mm  Radial: tri, 2.2 mm  Ulnar: tri, 1.9 mm  L arm:   Brachial: tri, 4.8 mm  Radial: tri, 2.2 mm  Ulnar: tri, 2.0 mm   Outside Studies/Documentation 4 pages of outside documents were reviewed including: outpatient Nephrology chart.   Medical Decision Making  Kyle OzVictor Plata is a 35 y.o. male who presents with ESRD requiring hemodialysis.   Based on vein mapping and examination, this patient's permanent access options include: L BC AVF, R BC AVF, R staged BVT.  I would start with L BC AVF  I had an extensive discussion with this patient in regards to the nature of access surgery, including risk, benefits, and alternatives.    The patient is aware that the risks of access surgery include but are not limited to: bleeding, infection, steal syndrome, nerve damage, ischemic monomelic neuropathy, failure of access to mature, and possible need for additional access procedures in the future.  The patient has agreed to proceed with the above procedure which will be scheduled 4 JAN 17.   Leonides SakeBrian Leonore Frankson, MD [facility] [place] Office: [phone] Pager: [phone]  07/27/2015, 5:00  PM

## 2015-08-18 ENCOUNTER — Other Ambulatory Visit: Payer: Self-pay | Admitting: *Deleted

## 2015-08-18 ENCOUNTER — Encounter (HOSPITAL_COMMUNITY): Payer: Self-pay | Admitting: Vascular Surgery

## 2015-08-18 DIAGNOSIS — Z4931 Encounter for adequacy testing for hemodialysis: Secondary | ICD-10-CM

## 2015-08-18 DIAGNOSIS — N186 End stage renal disease: Secondary | ICD-10-CM

## 2015-08-19 ENCOUNTER — Telehealth: Payer: Self-pay | Admitting: Vascular Surgery

## 2015-08-19 NOTE — Telephone Encounter (Signed)
LM for pt- may need interpreter to call, will try again next week, dpm

## 2015-08-19 NOTE — Telephone Encounter (Signed)
-----   Message from Sharee PimpleMarilyn K McChesney, RN sent at 08/18/2015  9:35 AM EST ----- Regarding: schedule   ----- Message -----    From: Larina Earthlyodd F Early, MD    Sent: 08/17/2015   5:40 PM      To: [facility] Charge Pool  Left wrist AV fistula creation. Asst. nurse. I need to see him in one month with duplex

## 2015-09-20 ENCOUNTER — Encounter: Payer: Self-pay | Admitting: Vascular Surgery

## 2015-09-27 ENCOUNTER — Encounter (HOSPITAL_COMMUNITY): Payer: Self-pay

## 2015-09-27 ENCOUNTER — Encounter: Payer: Self-pay | Admitting: Vascular Surgery

## 2017-05-29 IMAGING — DX DG CHEST 1V PORT
1 series · 1 of 1 positions shown · non-contrast
Comparison: None.

CLINICAL DATA: Postop chest post dialysis catheter insertion

EXAM:
PORTABLE CHEST 1 VIEW

[chest ap]
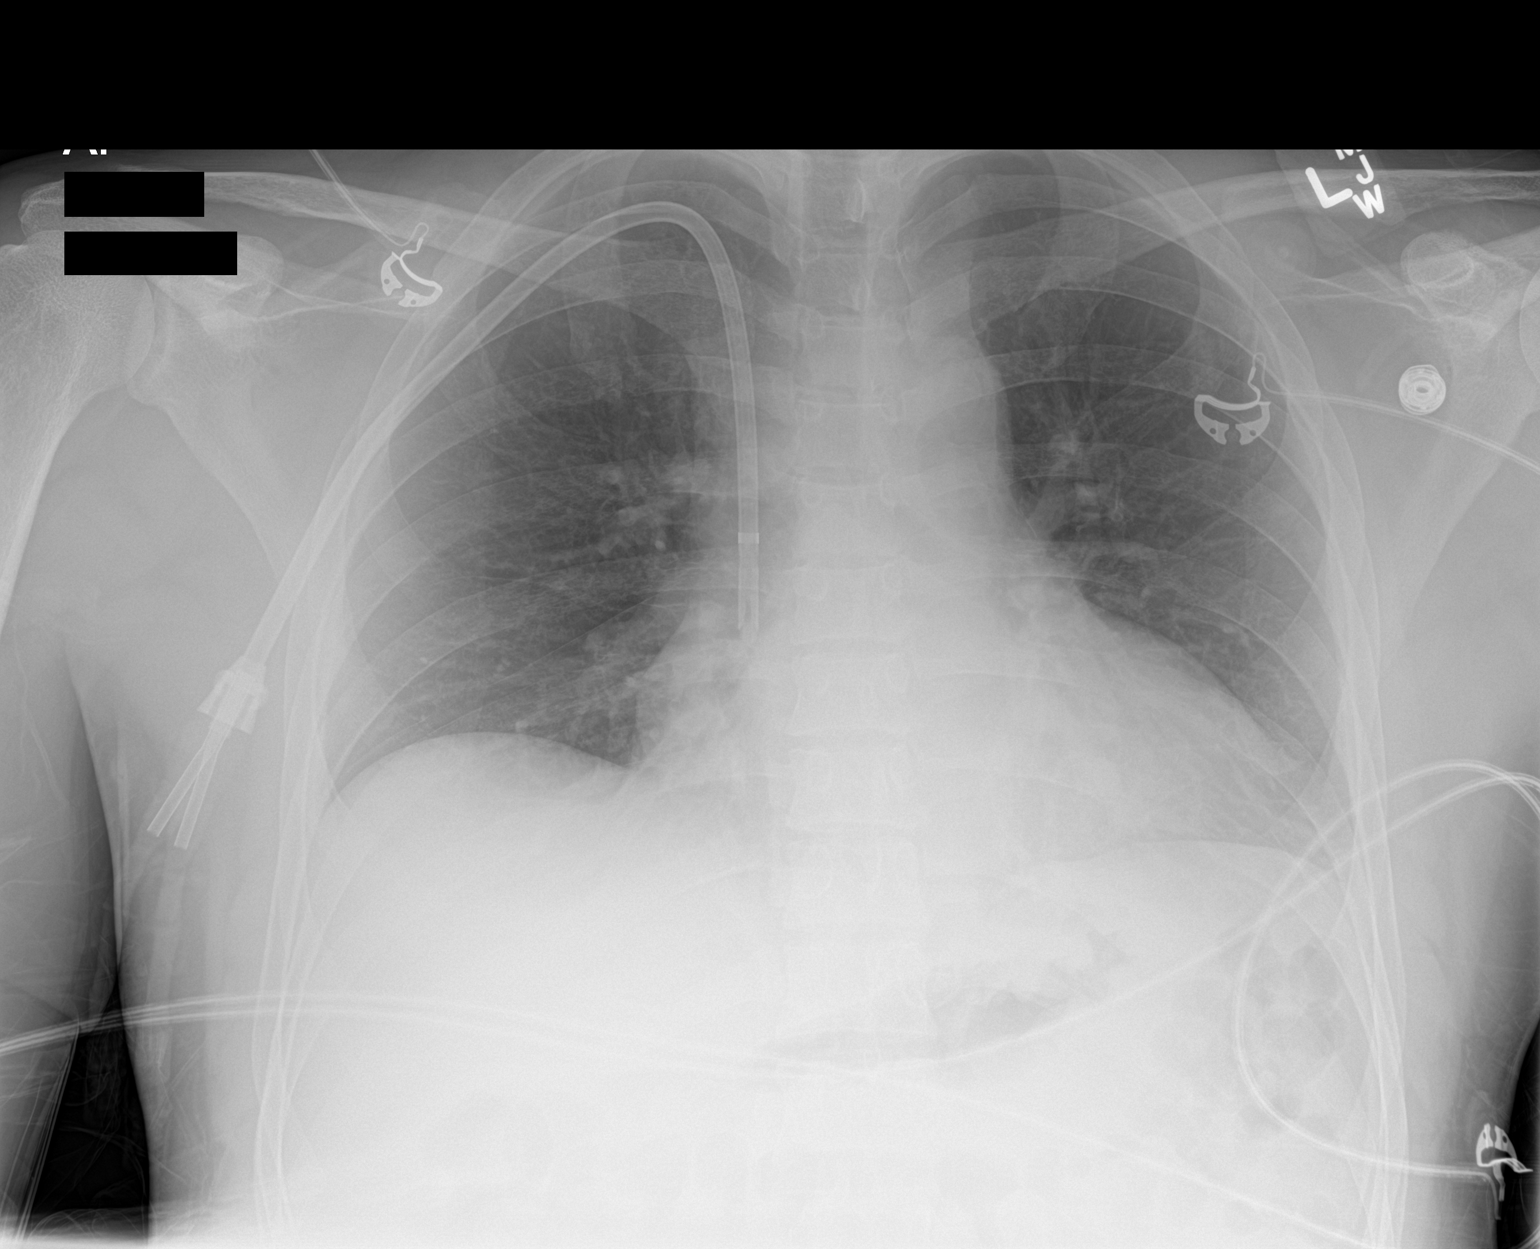

[1 of 1 positions shown; findings below may reference images not displayed]

FINDINGS: Borderline cardiomegaly. There is right IJ dialysis catheter with
tip in right atrium. No pneumothorax. No acute infiltrate or
pulmonary edema.
IMPRESSION: Right IJ dialysis catheter with tip in right atrium. No
pneumothorax.
